# Patient Record
Sex: Male | Born: 1945 | Race: Black or African American | Hispanic: No | Marital: Married | Smoking: Never smoker
Health system: Southern US, Community
[De-identification: ages and names within clinical notes are randomized; demographics above are authoritative.]

## PROBLEM LIST (undated history)

## (undated) DIAGNOSIS — T7840XA Allergy, unspecified, initial encounter: Secondary | ICD-10-CM

## (undated) HISTORY — DX: Allergy, unspecified, initial encounter: T78.40XA

---

## 2002-02-20 ENCOUNTER — Emergency Department (HOSPITAL_COMMUNITY): Admission: EM | Admit: 2002-02-20 | Discharge: 2002-02-20 | Payer: Self-pay | Admitting: Emergency Medicine

## 2006-09-24 ENCOUNTER — Ambulatory Visit (HOSPITAL_COMMUNITY): Admission: RE | Admit: 2006-09-24 | Discharge: 2006-09-24 | Payer: Self-pay | Admitting: Cardiology

## 2008-11-30 ENCOUNTER — Encounter: Admission: RE | Admit: 2008-11-30 | Discharge: 2008-11-30 | Payer: Self-pay | Admitting: Specialist

## 2009-04-23 ENCOUNTER — Ambulatory Visit (HOSPITAL_COMMUNITY): Admission: RE | Admit: 2009-04-23 | Discharge: 2009-04-23 | Payer: Self-pay | Admitting: Urology

## 2011-08-29 ENCOUNTER — Ambulatory Visit (INDEPENDENT_AMBULATORY_CARE_PROVIDER_SITE_OTHER): Payer: 59 | Admitting: Family Medicine

## 2011-08-29 VITALS — BP 126/83 | HR 73 | Temp 98.0°F | Resp 16 | Ht 67.0 in | Wt 177.0 lb

## 2011-08-29 DIAGNOSIS — R5381 Other malaise: Secondary | ICD-10-CM

## 2011-08-29 DIAGNOSIS — R358 Other polyuria: Secondary | ICD-10-CM

## 2011-08-29 DIAGNOSIS — Z0000 Encounter for general adult medical examination without abnormal findings: Secondary | ICD-10-CM

## 2011-08-29 DIAGNOSIS — R3589 Other polyuria: Secondary | ICD-10-CM

## 2011-08-29 DIAGNOSIS — R5383 Other fatigue: Secondary | ICD-10-CM

## 2011-08-29 LAB — POCT CBC
Granulocyte percent: 61.9 %G (ref 37–80)
HCT, POC: 40.4 % — AB (ref 43.5–53.7)
Hemoglobin: 13.2 g/dL — AB (ref 14.1–18.1)
Lymph, poc: 1.8 (ref 0.6–3.4)
MCH, POC: 29.4 pg (ref 27–31.2)
MCHC: 32.7 g/dL (ref 31.8–35.4)
MCV: 89.9 fL (ref 80–97)
MID (cbc): 0.5 (ref 0–0.9)
MPV: 9 fL (ref 0–99.8)
POC Granulocyte: 3.8 (ref 2–6.9)
POC LYMPH PERCENT: 29.8 %L (ref 10–50)
POC MID %: 8.3 %M (ref 0–12)
Platelet Count, POC: 234 10*3/uL (ref 142–424)
RBC: 4.49 M/uL — AB (ref 4.69–6.13)
RDW, POC: 14.2 %
WBC: 6.2 10*3/uL (ref 4.6–10.2)

## 2011-08-29 LAB — POCT URINALYSIS DIPSTICK
Bilirubin, UA: NEGATIVE
Glucose, UA: NEGATIVE
Ketones, UA: NEGATIVE
Leukocytes, UA: NEGATIVE
Nitrite, UA: NEGATIVE
Protein, UA: 300
Spec Grav, UA: 1.025
Urobilinogen, UA: 0.2
pH, UA: 6

## 2011-08-29 LAB — POCT UA - MICROSCOPIC ONLY
Bacteria, U Microscopic: NEGATIVE
Casts, Ur, LPF, POC: NEGATIVE
Crystals, Ur, HPF, POC: NEGATIVE
Mucus, UA: NEGATIVE
Yeast, UA: NEGATIVE

## 2011-08-29 LAB — PSA: PSA: 2.25 ng/mL (ref <=4.00)

## 2011-08-29 LAB — GLUCOSE, POCT (MANUAL RESULT ENTRY): POC Glucose: 86

## 2011-08-29 MED ORDER — ONDANSETRON 4 MG PO TBDP: 4.0000 mg | ORAL_TABLET | Freq: Three times a day (TID) | ORAL | Status: AC | PRN

## 2011-08-29 MED ORDER — MELOXICAM 7.5 MG PO TABS: 7.5000 mg | ORAL_TABLET | Freq: Every day | ORAL | Status: DC

## 2011-08-29 NOTE — Progress Notes (Signed)
66 yo man with malaise and nausea x 24 hours, worsening.  Notes hyperactive BS.  Some increased urination lately.  Feels tired. No N,V,D, cough, abdominal pain, body aches, fever, sweats, rash, blood in stool  O:  NAD, appears worried HEENT: some swollen turinates Chest: clear Heart: reg, no murmur or gallop Abd: hyperactive BS, nontender without mass or HSM Skin:  Warm and dry Results for orders placed in visit on 08/29/11  POCT CBC      Component Value Range   WBC 6.2  4.6 - 10.2 (K/uL)   Lymph, poc 1.8  0.6 - 3.4    POC LYMPH PERCENT 29.8  10 - 50 (%L)   MID (cbc) 0.5  0 - 0.9    POC MID % 8.3  0 - 12 (%M)   POC Granulocyte 3.8  2 - 6.9    Granulocyte percent 61.9  37 - 80 (%G)   RBC 4.49 (*) 4.69 - 6.13 (M/uL)   Hemoglobin 13.2 (*) 14.1 - 18.1 (g/dL)   HCT, POC 16.1 (*) 09.6 - 53.7 (%)   MCV 89.9  80 - 97 (fL)   MCH, POC 29.4  27 - 31.2 (pg)   MCHC 32.7  31.8 - 35.4 (g/dL)   RDW, POC 04.5     Platelet Count, POC 234  142 - 424 (K/uL)   MPV 9.0  0 - 99.8 (fL)  GLUCOSE, POCT (MANUAL RESULT ENTRY)      Component Value Range   POC Glucose 86    POCT UA - MICROSCOPIC ONLY      Component Value Range   WBC, Ur, HPF, POC 0-1     RBC, urine, microscopic 0-2     Bacteria, U Microscopic neg     Mucus, UA neg     Epithelial cells, urine per micros 1-2     Crystals, Ur, HPF, POC neg     Casts, Ur, LPF, POC neg     Yeast, UA neg    POCT URINALYSIS DIPSTICK      Component Value Range   Color, UA yellow     Clarity, UA clear     Glucose, UA neg     Bilirubin, UA neg     Ketones, UA neg     Spec Grav, UA 1.025     Blood, UA tr     pH, UA 6.0     Protein, UA 300     Urobilinogen, UA 0.2     Nitrite, UA neg     Leukocytes, UA Negative

## 2012-08-20 ENCOUNTER — Ambulatory Visit (INDEPENDENT_AMBULATORY_CARE_PROVIDER_SITE_OTHER): Payer: 59 | Admitting: Physician Assistant

## 2012-08-20 VITALS — BP 110/80 | HR 80 | Temp 98.2°F | Resp 18 | Ht 67.0 in | Wt 174.0 lb

## 2012-08-20 DIAGNOSIS — Z0000 Encounter for general adult medical examination without abnormal findings: Secondary | ICD-10-CM

## 2012-08-20 DIAGNOSIS — Z1211 Encounter for screening for malignant neoplasm of colon: Secondary | ICD-10-CM

## 2012-08-20 DIAGNOSIS — Z23 Encounter for immunization: Secondary | ICD-10-CM

## 2012-08-20 DIAGNOSIS — Z1159 Encounter for screening for other viral diseases: Secondary | ICD-10-CM

## 2012-08-20 DIAGNOSIS — Z125 Encounter for screening for malignant neoplasm of prostate: Secondary | ICD-10-CM

## 2012-08-20 LAB — LIPID PANEL
HDL: 58 mg/dL (ref >39)
LDL Cholesterol: 168 mg/dL — ABNORMAL HIGH (ref 0–99)
Triglycerides: 118 mg/dL (ref <150)

## 2012-08-20 LAB — POCT UA - MICROSCOPIC ONLY
Casts, Ur, LPF, POC: NEGATIVE
Crystals, Ur, HPF, POC: NEGATIVE
Epithelial cells, urine per micros: NEGATIVE
Yeast, UA: NEGATIVE

## 2012-08-20 LAB — POCT CBC
HCT, POC: 50.6 % (ref 43.5–53.7)
Lymph, poc: 2.5 (ref 0.6–3.4)
MCHC: 31.6 g/dL — AB (ref 31.8–35.4)
MCV: 93.4 fL (ref 80–97)
MID (cbc): 0.6 (ref 0–0.9)
POC Granulocyte: 3.9 (ref 2–6.9)
POC LYMPH PERCENT: 35.7 %L (ref 10–50)
RDW, POC: 13.4 %

## 2012-08-20 LAB — COMPREHENSIVE METABOLIC PANEL
AST: 52 U/L — ABNORMAL HIGH (ref 0–37)
Alkaline Phosphatase: 53 U/L (ref 39–117)
BUN: 13 mg/dL (ref 6–23)
Calcium: 9.9 mg/dL (ref 8.4–10.5)
Chloride: 104 mEq/L (ref 96–112)
Creat: 1 mg/dL (ref 0.50–1.35)
Total Bilirubin: 0.9 mg/dL (ref 0.3–1.2)

## 2012-08-20 LAB — POCT URINALYSIS DIPSTICK
Nitrite, UA: NEGATIVE
Protein, UA: >=300
Urobilinogen, UA: 0.2
pH, UA: 5.5

## 2012-08-20 MED ORDER — ZOSTER VACCINE LIVE 19400 UNT/0.65ML ~~LOC~~ SOLR: 0.6500 mL | Freq: Once | SUBCUTANEOUS | Status: DC

## 2012-08-20 NOTE — Patient Instructions (Signed)

## 2012-08-20 NOTE — Progress Notes (Signed)
Subjective:    Patient ID: Justin Mclaughlin, male    DOB: [birth year], 67 y.o.   MRN: [id number]  HPI This 67 y.o. male presents for CPE.  In particular, he wants to make sure his blood sugar and cholesterol are normal.  Colonoscopy was 1-2 years ago and normal.  PSA 1-2 years ago.  Doesn't know when he's had a tetanus vaccine.  Desires pneumococcal and shingles vaccines.   Past Medical History  Diagnosis Date  . Allergy     History reviewed. No pertinent past surgical history.  Prior to Admission medications   Medication Sig Start Date End Date Taking? Authorizing Provider  aspirin 325 MG tablet Take 325 mg by mouth daily.   Yes Historical Provider, MD    No Known Allergies  History   Social History  . Marital Status: Married    Spouse Name: N/A    Number of Children: 5  . Years of Education: 12   Occupational History  . 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 Smoking status: Never Smoker   . Smokeless tobacco: Never Used  . Alcohol Use: 0 - 1 oz/week    0-2 drink(s) per week     Comment: once a month  . Drug Use: No  . Sexually Active: Yes -- Male partner(s)   Other Topics Concern  . Not on file   Social History Narrative   From [place], in the [place] since 2001.  Lives with his wife.  Their children are grown.    Family History  Problem Relation Age of Onset  . Cancer Sister     breast cancer     Review of Systems  Constitutional: Negative.   HENT: Negative.   Eyes: Positive for visual disturbance (evaluation ongoing at a vision center). Negative for photophobia, pain, discharge, redness and itching.  Respiratory: Negative.   Cardiovascular: Negative.   Gastrointestinal: Negative.   Endocrine: Negative.   Genitourinary: Negative.        Nocturia treated with an OTC product.  Musculoskeletal: Positive for arthralgias (with long work hours-ASA with relief). Negative for myalgias, back pain, joint swelling and gait  problem.  Skin: Negative.   Allergic/Immunologic: Negative.   Neurological: Negative.   Hematological: Negative.   Psychiatric/Behavioral: Negative.        Objective:   Physical Exam  Vitals reviewed. Constitutional: He is oriented to person, place, and time. Vital signs are normal. He appears well-developed and well-nourished. He is active and cooperative.  Non-toxic appearance. He does not have a sickly appearance. He does not appear ill. No distress.  HENT:  Head: Normocephalic and atraumatic. No trismus in the jaw.  Right Ear: Hearing, tympanic membrane, external ear and ear canal normal.  Left Ear: Hearing, tympanic membrane, external ear and ear canal normal.  Nose: Nose normal.  Mouth/Throat: Uvula is midline, oropharynx is clear and moist and mucous membranes are normal. He does not have dentures. No oral lesions. Normal dentition. No dental abscesses, edematous, lacerations or dental caries.  Eyes: Conjunctivae and EOM are normal. Pupils are equal, round, and reactive to light. Right eye exhibits no discharge. Left eye exhibits no discharge. No scleral icterus.  Fundoscopic exam:      The right eye shows no arteriolar narrowing, no AV nicking, no exudate, no hemorrhage and no papilledema.       The left eye shows no arteriolar narrowing, no AV nicking, no exudate, no hemorrhage and no papilledema.  Neck: Normal range  of motion, full passive range of motion without pain and phonation normal. Neck supple. No spinous process tenderness and no muscular tenderness present. No rigidity. No tracheal deviation, no edema, no erythema and normal range of motion present. No thyromegaly present.  Cardiovascular: Normal rate, regular rhythm, S1 normal, S2 normal, normal heart sounds, intact distal pulses and normal pulses.  Exam reveals no gallop and no friction rub.   No murmur heard. Pulmonary/Chest: Effort normal and breath sounds normal. No respiratory distress. He has no wheezes. He has no  rales.  Abdominal: Soft. Normal appearance and bowel sounds are normal. He exhibits no distension and no mass. There is no hepatosplenomegaly. There is no tenderness. There is no rebound and no guarding. No hernia. Hernia confirmed negative in the right inguinal area and confirmed negative in the left inguinal area.  Genitourinary: Rectum normal, prostate normal, testes normal and penis normal. Rectal exam shows no external hemorrhoid, no internal hemorrhoid, no fissure, no mass, no tenderness and anal tone normal. Guaiac negative stool. Uncircumcised. No phimosis, paraphimosis, hypospadias, penile erythema or penile tenderness. No discharge found.  Musculoskeletal: Normal range of motion. He exhibits no edema and no tenderness.       Right shoulder: Normal.       Left shoulder: Normal.       Right elbow: Normal.      Left elbow: Normal.       Right wrist: Normal.       Left wrist: Normal.       Right hip: Normal.       Left hip: Normal.       Right knee: Normal.       Left knee: Normal.       Right ankle: Normal. Achilles tendon normal.       Left ankle: Normal. Achilles tendon normal.       Cervical back: Normal. He exhibits normal range of motion, no tenderness, no bony tenderness, no swelling, no edema, no deformity, no laceration, no pain, no spasm and normal pulse.       Thoracic back: Normal.       Lumbar back: Normal.       Right upper arm: Normal.       Left upper arm: Normal.       Right forearm: Normal.       Left forearm: Normal.       Right hand: Normal.       Left hand: Normal.       Right upper leg: Normal.       Left upper leg: Normal.       Right lower leg: Normal.       Left lower leg: Normal.       Right foot: Normal.       Left foot: Normal.  Lymphadenopathy:       Head (right side): No submental, no submandibular, no tonsillar, no preauricular, no posterior auricular and no occipital adenopathy present.       Head (left side): No submental, no submandibular, no  tonsillar, no preauricular, no posterior auricular and no occipital adenopathy present.    He has no cervical adenopathy.       Right: No inguinal and no supraclavicular adenopathy present.       Left: No inguinal and no supraclavicular adenopathy present.  Neurological: He is alert and oriented to person, place, and time. He has normal strength and normal reflexes. He displays no tremor. No cranial nerve deficit. He exhibits  normal muscle tone. Coordination and gait normal.  Skin: Skin is warm, dry and intact. No abrasion, no ecchymosis, no laceration, no lesion and no rash noted. He is not diaphoretic. No cyanosis or erythema. No pallor. Nails show no clubbing.  Psychiatric: He has a normal mood and affect. His speech is normal and behavior is normal. Judgment and thought content normal. Cognition and memory are normal.      Results for orders placed in visit on 08/20/12  POCT CBC      Result Value Range   WBC 6.9  4.6 - 10.2 K/uL   Lymph, poc 2.5  0.6 - 3.4   POC LYMPH PERCENT 35.7  10 - 50 %L   MID (cbc) 0.6  0 - 0.9   POC MID % 8.4  0 - 12 %M   POC Granulocyte 3.9  2 - 6.9   Granulocyte percent 55.9  37 - 80 %G   RBC 5.42  4.69 - 6.13 M/uL   Hemoglobin 16.0  14.1 - 18.1 g/dL   HCT, POC 13.0  86.5 - 53.7 %   MCV 93.4  80 - 97 fL   MCH, POC 29.5  27 - 31.2 pg   MCHC 31.6 (*) 31.8 - 35.4 g/dL   RDW, POC 78.4     Platelet Count, POC 309  142 - 424 K/uL   MPV 9.1  0 - 99.8 fL  IFOBT (OCCULT BLOOD)      Result Value Range   IFOBT Negative    POCT UA - MICROSCOPIC ONLY      Result Value Range   WBC, Ur, HPF, POC 0-1     RBC, urine, microscopic 0-1     Bacteria, U Microscopic trace     Mucus, UA small     Epithelial cells, urine per micros neg     Crystals, Ur, HPF, POC neg     Casts, Ur, LPF, POC neg     Yeast, UA neg    POCT URINALYSIS DIPSTICK      Result Value Range   Color, UA yellow     Clarity, UA clear     Glucose, UA neg     Bilirubin, UA neg     Ketones, UA neg      Spec Grav, UA >=1.030     Blood, UA small     pH, UA 5.5     Protein, UA >=300     Urobilinogen, UA 0.2     Nitrite, UA neg     Leukocytes, UA Negative         Assessment & Plan:  Routine general medical examination at a health care facility - Plan: POCT CBC, POCT UA - Microscopic Only, POCT urinalysis dipstick, Comprehensive metabolic panel, Lipid panel, TSH; age appropriate anticipatory guidance provided.  Screening for colon cancer - Plan: IFOBT POC (occult bld, rslt in office)  Screening for prostate cancer - Plan: PSA  Need for Tdap vaccination - Plan: Tdap vaccine greater than or equal to 7yo IM  Need for pneumococcal vaccination - Plan: Pneumococcal polysaccharide vaccine 23-valent greater than or equal to 2yo subcutaneous/IM  Need for shingles vaccine - Plan: zoster vaccine live, PF, (ZOSTAVAX) 69629 UNT/0.65ML injection  Need for hepatitis C screening test - Plan: Hepatitis C antibody

## 2012-08-21 ENCOUNTER — Encounter: Payer: Self-pay | Admitting: Physician Assistant

## 2012-08-23 ENCOUNTER — Telehealth: Payer: Self-pay

## 2012-08-23 DIAGNOSIS — Z23 Encounter for immunization: Secondary | ICD-10-CM

## 2012-08-23 MED ORDER — ZOSTER VACCINE LIVE 19400 UNT/0.65ML ~~LOC~~ SOLR: 0.6500 mL | Freq: Once | SUBCUTANEOUS | Status: DC

## 2012-08-23 NOTE — Telephone Encounter (Signed)
Spoke w/pt and explained that [facility] does not do the inj for Shingles virus. Pt requested Rx be resent to [facility] who does administer the injections.

## 2012-08-28 ENCOUNTER — Telehealth: Payer: Self-pay

## 2012-08-28 ENCOUNTER — Telehealth: Payer: Self-pay | Admitting: Radiology

## 2012-08-28 DIAGNOSIS — E785 Hyperlipidemia, unspecified: Secondary | ICD-10-CM

## 2012-08-28 MED ORDER — SIMVASTATIN 20 MG PO TABS: 20.0000 mg | ORAL_TABLET | Freq: Every day | ORAL | Status: DC

## 2012-08-28 NOTE — Telephone Encounter (Signed)
Rx sent.  Please ask him to take it in the evening with his meal.  Recheck in 3 months.  Meds ordered this encounter  Medications  . simvastatin (ZOCOR) 20 MG tablet    Sig: Take 1 tablet (20 mg total) by mouth at bedtime.    Dispense:  90 tablet    Refill:  3    Order Specific Question:  Supervising Provider    Answer:  DOOLITTLE, ROBERT P [[id number]]

## 2012-08-28 NOTE — Telephone Encounter (Signed)
Left message for him to advise  

## 2012-08-28 NOTE — Telephone Encounter (Signed)
PATIENT STATES THAT HE RECEIVED A LETTER STATING THAT HIS CHOLESTEROL IS HIGH. WANTS TO KNOW IF HE NEEDS AN RX FOR THIS. ALSO WANTS TO GET ANY OTHER RESULTS FROM HIS LAST VISIT. PLEASE CALL AT [phone].

## 2012-08-28 NOTE — Telephone Encounter (Signed)
Patient called back, and Luster Landsberg' has given him the information.

## 2012-08-28 NOTE — Telephone Encounter (Signed)
The test results show that your kidney and liver function, thyroid function, blood sugar and prostate are all normal. However, your cholesterol is too high.  I recommend that you start a prescription medication to lower it. Please contact my office to let me know whether or not you are agreeable with that. We recommend that you repeat the above test(s) in 3 months. This is from letter, he does want medication for this, please advise.

## 2013-01-27 ENCOUNTER — Ambulatory Visit (INDEPENDENT_AMBULATORY_CARE_PROVIDER_SITE_OTHER): Payer: 59 | Admitting: Family Medicine

## 2013-01-27 VITALS — BP 122/78 | HR 62 | Temp 98.1°F | Resp 18 | Ht 61.0 in | Wt 161.7 lb

## 2013-01-27 DIAGNOSIS — E7800 Pure hypercholesterolemia, unspecified: Secondary | ICD-10-CM

## 2013-01-27 DIAGNOSIS — R809 Proteinuria, unspecified: Secondary | ICD-10-CM

## 2013-01-27 DIAGNOSIS — Z23 Encounter for immunization: Secondary | ICD-10-CM

## 2013-01-27 LAB — LIPID PANEL
HDL: 64 mg/dL (ref >39)
LDL Cholesterol: 150 mg/dL — ABNORMAL HIGH (ref 0–99)
Triglycerides: 75 mg/dL (ref <150)

## 2013-01-27 LAB — POCT URINALYSIS DIPSTICK
Bilirubin, UA: NEGATIVE
Ketones, UA: NEGATIVE
Spec Grav, UA: >=1.03
pH, UA: 5.5

## 2013-01-27 LAB — POCT UA - MICROSCOPIC ONLY
Bacteria, U Microscopic: NEGATIVE
Casts, Ur, LPF, POC: NEGATIVE
Crystals, Ur, HPF, POC: NEGATIVE
Mucus, UA: POSITIVE

## 2013-01-27 LAB — COMPREHENSIVE METABOLIC PANEL
Albumin: 4.4 g/dL (ref 3.5–5.2)
Alkaline Phosphatase: 54 U/L (ref 39–117)
BUN: 15 mg/dL (ref 6–23)
Calcium: 9.8 mg/dL (ref 8.4–10.5)
Glucose, Bld: 81 mg/dL (ref 70–99)
Potassium: 4.7 mEq/L (ref 3.5–5.3)

## 2013-01-27 NOTE — Patient Instructions (Addendum)
I will be in touch with your labs when they come in.       Influenza Vaccine (Flu Vaccine, Inactivated) 2013 2014 What You Need to Know WHY GET VACCINATED?  Influenza ("flu") is a contagious disease that spreads around the [place] every winter, usually between October and May.  Flu is caused by the influenza virus, and can be spread by coughing, sneezing, and close contact.  Anyone can get flu, but the risk of getting flu is highest among children. Symptoms come on suddenly and may last several days. They can include:  Fever or chills.  Sore throat.  Muscle aches.  Fatigue.  Cough.  Headache.  Runny or stuffy nose. Flu can make some people much sicker than others. These people include young children, people 89 and older, pregnant women, and people with certain health conditions such as heart, lung or kidney disease, or a weakened immune system. Flu vaccine is especially important for these people, and anyone in close contact with them. Flu can also lead to pneumonia, and make existing medical conditions worse. It can cause diarrhea and seizures in children. Each year thousands of people in the [place] die from flu, and many more are hospitalized. Flu vaccine is the best protection we have from flu and its complications. Flu vaccine also helps prevent spreading flu from person to person. INACTIVATED FLU VACCINE There are 2 types of influenza vaccine:  You are getting an inactivated flu vaccine, which does not contain any live influenza virus. It is given by injection with a needle, and often called the "flu shot."  A different live, attenuated (weakened) influenza vaccine is sprayed into the nostrils. This vaccine is described in a separate Vaccine Information Statement. Flu vaccine is recommended every year. Children 6 months through 37 years of age should get 2 doses the first year they get vaccinated. Flu viruses are always changing. Each year's flu vaccine is made  to protect from viruses that are most likely to cause disease that year. While flu vaccine cannot prevent all cases of flu, it is our best defense against the disease. Inactivated flu vaccine protects against 3 or 4 different influenza viruses. It takes about 2 weeks for protection to develop after the vaccination, and protection lasts several months to a year. Some illnesses that are not caused by influenza virus are often mistaken for flu. Flu vaccine will not prevent these illnesses. It can only prevent influenza. A "high-dose" flu vaccine is available for people 52 years of age and older. The person giving you the vaccine can tell you more about it. Some inactivated flu vaccine contains a very small amount of a mercury-based preservative called thimerosal. Studies have shown that thimerosal in vaccines is not harmful, but flu vaccines that do not contain a preservative are available. SOME PEOPLE SHOULD NOT GET THIS VACCINE Tell the person who gives you the vaccine:  If you have any severe (life-threatening) allergies. If you ever had a life-threatening allergic reaction after a dose of flu vaccine, or have a severe allergy to any part of this vaccine, you may be advised not to get a dose. Most, but not all, types of flu vaccine contain a small amount of egg.  If you ever had Guillain Barr Syndrome (a severe paralyzing illness, also called GBS). Some people with a history of GBS should not get this vaccine. This should be discussed with your doctor.  If you are not feeling well. They might suggest waiting until you feel  better. But you should come back. RISKS OF A VACCINE REACTION With a vaccine, like any medicine, there is a chance of side effects. These are usually mild and go away on their own. Serious side effects are also possible, but are very rare. Inactivated flu vaccine does not contain live flu virus, sogetting flu from this vaccine is not possible. Brief fainting spells and related  symptoms (such as jerking movements) can happen after any medical procedure, including vaccination. Sitting or lying down for about 15 minutes after a vaccination can help prevent fainting and injuries caused by falls. Tell your doctor if you feel dizzy or lightheaded, or have vision changes or ringing in the ears. Mild problems following inactivated flu vaccine:  Soreness, redness, or swelling where the shot was given.  Hoarseness; sore, red or itchy eyes; or cough.  Fever.  Aches.  Headache.  Itching.  Fatigue. If these problems occur, they usually begin soon after the shot and last 1 or 2 days. Moderate problems following inactivated flu vaccine:  Young children who get inactivated flu vaccine and pneumococcal vaccine (PCV13) at the same time may be at increased risk for seizures caused by fever. Ask your doctor for more information. Tell your doctor if a child who is getting flu vaccine has ever had a seizure. Severe problems following inactivated flu vaccine:  A severe allergic reaction could occur after any vaccine (estimated less than 1 in a million doses).  There is a small possibility that inactivated flu vaccine could be associated with Guillan Barr Syndrome (GBS), no more than 1 or 2 cases per million people vaccinated. This is much lower than the risk of severe complications from flu, which can be prevented by flu vaccine. The safety of vaccines is always being monitored. For more information, visit: http://floyd.org/ WHAT IF THERE IS A SERIOUS REACTION? What should I look for?  Look for anything that concerns you, such as signs of a severe allergic reaction, very high fever, or behavior changes. Signs of a severe allergic reaction can include hives, swelling of the face and throat, difficulty breathing, a fast heartbeat, dizziness, and weakness. These would start a few minutes to a few hours after the vaccination. What should I do?  If you think it is a severe  allergic reaction or other emergency that cannot wait, call 9 1 1  or get the person to the nearest hospital. Otherwise, call your doctor.  Afterward, the reaction should be reported to the Vaccine Adverse Event Reporting System (VAERS). Your doctor might file this report, or you can do it yourself through the VAERS website at www.vaers.LAgents.no, or by calling 1-(939) 577-8213. VAERS is only for reporting reactions. They do not give medical advice. THE NATIONAL VACCINE INJURY COMPENSATION PROGRAM The National Vaccine Injury Compensation Program (VICP) is a federal program that was created to compensate people who may have been injured by certain vaccines. Persons who believe they may have been injured by a vaccine can learn about the program and about filing a claim by calling 1-908-213-6441 or visiting the VICP website at SpiritualWord.at HOW CAN I LEARN MORE?  Ask your doctor.  Call your local or state health department.  Contact the Centers for Disease Control and Prevention (CDC):  Call (314)477-2392 (1-800-CDC-INFO) or  Visit CDC's website at BiotechRoom.com.cy CDC Inactivated Influenza Vaccine Interim VIS (12/15/11) Document Released: 03/02/2006 Document Revised: 01/31/2012 Document Reviewed: 12/15/2011 [facility] Patient Information 2014 [place], [place].

## 2013-01-27 NOTE — Progress Notes (Signed)
[facility] [address] [place] [address] [phone]  Date:  01/27/2013   Name:  Justin Mclaughlin   DOB:  06/04/1945   MRN:  [id number]  PCP:  No primary provider on file.    Chief Complaint: Follow-up   History of Present Illness:  Justin Mclaughlin is a 67 y.o. very pleasant male patient who presents with the following:  He is here today for a lab recheck.  He strated simvastatin in the spring time due to high cholesterol  He is fasting today for labs.  He was also told that he had proteinuria, and would like to reheck this today as well Overall he is doing well, no problems with his new medication Would like a flu shot today  There are no active problems to display for this patient.   Past Medical History  Diagnosis Date  . Allergy     History reviewed. No pertinent past surgical history.  History  Substance Use Topics  . Smoking status: Never Smoker   . Smokeless tobacco: Never Used  . Alcohol Use: 0 - 1 oz/week    0-2 drink(s) per week     Comment: once a month    Family History  Problem Relation Age of Onset  . Cancer Sister     breast cancer    No Known Allergies  Medication list has been reviewed and updated.  Current Outpatient Prescriptions on File Prior to Visit  Medication Sig Dispense Refill  . aspirin 325 MG tablet Take 325 mg by mouth daily.      . simvastatin (ZOCOR) 20 MG tablet Take 1 tablet (20 mg total) by mouth at bedtime.  90 tablet  3  . zoster vaccine live, PF, (ZOSTAVAX) 08657 UNT/0.65ML injection Inject 19,400 Units into the skin once.  1 each  0   No current facility-administered medications on file prior to visit.    Review of Systems:  As per HPI- otherwise negative.   Physical Examination: Filed Vitals:   01/27/13 1059  BP: 122/78  Pulse: 62  Temp: 98.1 F (36.7 C)  Resp: 18   Filed Vitals:   01/27/13 1059  Height: 5\' 1"  (1.549 m)  Weight: 161 lb 11.2 oz (73.347 kg)   Body mass  index is 30.57 kg/(m^2). Ideal Body Weight: Weight in (lb) to have BMI = 25: 132  GEN: WDWN, NAD, Non-toxic, A & O x 3, looks well HEENT: Atraumatic, Normocephalic. Neck supple. No masses, No LAD. Ears and Nose: No external deformity. CV: RRR, No M/G/R. No JVD. No thrill. No extra heart sounds. PULM: CTA B, no wheezes, crackles, rhonchi. No retractions. No resp. distress. No accessory muscle use. ABD: S, NT, ND. No rebound. No HSM. EXTR: No c/c/e NEURO Normal gait.  PSYCH: Normally interactive. Conversant. Not depressed or anxious appearing.  Calm demeanor.    Assessment and Plan: High cholesterol - Plan: Comprehensive metabolic panel, Lipid panel, Flu Vaccine QUAD 36+ mos IM  Proteinuria - Plan: POCT urinalysis dipstick, POCT UA - Microscopic Only, Microalbumin, urine  Will check on LFTs and cholesterol level today.   History of proteinuria; noted over the last year.  Will check renal function and microalbmin.  May need a 24 hour protein check.   Will plan further follow- up pending labs.   Signed Abbe Amsterdam, MD

## 2013-02-02 ENCOUNTER — Encounter: Payer: Self-pay | Admitting: Family Medicine

## 2013-02-02 ENCOUNTER — Telehealth: Payer: Self-pay | Admitting: Family Medicine

## 2013-02-02 NOTE — Addendum Note (Signed)
Addended by: Abbe Amsterdam C on: 02/02/2013 07:23 AM   Modules accepted: Orders

## 2013-02-02 NOTE — Telephone Encounter (Signed)
Called and went over his labs.  We will refer him to nephrology, please follow- up with [place] in 6 months.

## 2013-02-10 ENCOUNTER — Telehealth: Payer: Self-pay

## 2013-02-10 NOTE — Telephone Encounter (Signed)
Patient would like to speak to Dr. Patsy Lager. He just received lab results and has a few elevated results and wanted to know what he can do to bring them down.  Best [phone]

## 2013-02-10 NOTE — Telephone Encounter (Signed)
Patient advised he is to see a nephrologist for his proteinuria, he has not heard back yet. Will you check on this for him?

## 2013-03-10 ENCOUNTER — Telehealth: Payer: Self-pay

## 2013-03-10 ENCOUNTER — Encounter: Payer: Self-pay | Admitting: *Deleted

## 2013-03-10 ENCOUNTER — Ambulatory Visit (INDEPENDENT_AMBULATORY_CARE_PROVIDER_SITE_OTHER): Payer: Managed Care, Other (non HMO) | Admitting: Internal Medicine

## 2013-03-10 VITALS — BP 108/62 | HR 65 | Temp 97.8°F | Resp 16 | Ht 67.0 in | Wt 171.2 lb

## 2013-03-10 DIAGNOSIS — R809 Proteinuria, unspecified: Secondary | ICD-10-CM

## 2013-03-10 DIAGNOSIS — R350 Frequency of micturition: Secondary | ICD-10-CM

## 2013-03-10 DIAGNOSIS — E785 Hyperlipidemia, unspecified: Secondary | ICD-10-CM

## 2013-03-10 LAB — POCT UA - MICROSCOPIC ONLY
Bacteria, U Microscopic: NEGATIVE
Casts, Ur, LPF, POC: NEGATIVE
Epithelial cells, urine per micros: NEGATIVE
Mucus, UA: NEGATIVE
WBC, Ur, HPF, POC: NEGATIVE
Yeast, UA: NEGATIVE

## 2013-03-10 LAB — POCT URINALYSIS DIPSTICK
Protein, UA: 300
Spec Grav, UA: 1.025
pH, UA: 5.5

## 2013-03-10 MED ORDER — CIPROFLOXACIN HCL 500 MG PO TABS: 500.0000 mg | ORAL_TABLET | Freq: Two times a day (BID) | ORAL | Status: DC

## 2013-03-10 NOTE — Telephone Encounter (Signed)
Spoke to him, he wants medication for urinary frequency. Advised him to return to clinic.

## 2013-03-10 NOTE — Progress Notes (Signed)
Subjective:    Patient ID: Justin Mclaughlin, male    DOB: 02-Sep-1945, 67 y.o.   MRN: [id number]  HPI Having urinary frequency for several days. No hematuria, color darker than usual. Pain with urination intermittently, releived with tylenol. No back pain. No fever or chills. Nocturia x 3 last night- good night. Having to urinate every hour or more. Weak urine pressure. No discharge from penis.  Has had difficulty getting all of urine out for a long time. Had a biopsy several years ago. Saw urologist last year and was told his prostate was normal. Goes annually for exam and PSA.  Was advised by Dr. Dallas Schimke that he had proteinuria and was referred to nephrology. Patient did not understand this and reports that he did not receive an appointment.  Appetite normal, no diarrhea, no constipation.  Review of Systems  Constitutional: Negative for fever, chills, fatigue and unexpected weight change.  Respiratory: Negative for cough and shortness of breath.   Cardiovascular: Negative for chest pain.  Gastrointestinal: Negative for nausea, vomiting, abdominal pain, diarrhea, constipation, blood in stool, abdominal distention and rectal pain.  Genitourinary: Positive for dysuria, urgency and frequency. Negative for hematuria, flank pain, discharge and penile pain.  Musculoskeletal: Negative for back pain.       Objective:   Physical Exam  Nursing note and vitals reviewed. Constitutional: He is oriented to person, place, and time. He appears well-developed and well-nourished.  Cardiovascular: Normal rate, regular rhythm and normal heart sounds.   Pulmonary/Chest: Effort normal and breath sounds normal.  Abdominal: Soft. Bowel sounds are normal. He exhibits no distension and no mass. There is no tenderness. There is no rebound.  Genitourinary: Prostate normal.  Prostate smooth, non tender, non boggy.  Musculoskeletal: Normal range of motion. He exhibits no edema.  No flank pain.  Neurological:  He is alert and oriented to person, place, and time.  Skin: Skin is warm and dry.   Very pleasant man in NAD.  Results for orders placed in visit on 03/10/13  POCT URINALYSIS DIPSTICK      Result Value Range   Color, UA yellow     Clarity, UA clear     Glucose, UA neg     Bilirubin, UA nge     Ketones, UA trace     Spec Grav, UA 1.025     Blood, UA trace     pH, UA 5.5     Protein, UA 300     Urobilinogen, UA 1.0     Nitrite, UA neg     Leukocytes, UA Negative    POCT UA - MICROSCOPIC ONLY      Result Value Range   WBC, Ur, HPF, POC neg     RBC, urine, microscopic 0-2     Bacteria, U Microscopic neg     Mucus, UA neg     Epithelial cells, urine per micros neg     Crystals, Ur, HPF, POC neg     Casts, Ur, LPF, POC neg     Yeast, UA neg         Assessment & Plan:  Urinary frequency - Plan: POCT urinalysis dipstick, POCT UA - Microscopic Only, Urine culture, Ambulatory referral to Urology  1- discussed UA and physical findings with patient. His symptoms are certainly consistent with prostatitis or with a urinary outflow obstruction.   Will culture urine and start Cipro 500 mg po BID with referral to Urology for urinary frequency and decreased flow. 2- Continued proteinuria.  Discussed importance of Nephrology follow up. Will refer.  DG/RPD

## 2013-03-10 NOTE — Telephone Encounter (Signed)
PT WOULD LIKE TO HAVE HIS MEDICATION CHANGED. PLEASE CALL [phone]   [facility] [address]

## 2013-09-26 ENCOUNTER — Ambulatory Visit (INDEPENDENT_AMBULATORY_CARE_PROVIDER_SITE_OTHER): Payer: Managed Care, Other (non HMO) | Admitting: Emergency Medicine

## 2013-09-26 VITALS — BP 122/78 | HR 61 | Temp 97.5°F | Resp 18 | Ht 67.0 in | Wt 167.2 lb

## 2013-09-26 DIAGNOSIS — J018 Other acute sinusitis: Secondary | ICD-10-CM

## 2013-09-26 MED ORDER — AMOXICILLIN-POT CLAVULANATE 875-125 MG PO TABS: 1.0000 | ORAL_TABLET | Freq: Two times a day (BID) | ORAL | Status: DC

## 2013-09-26 MED ORDER — PSEUDOEPHEDRINE-GUAIFENESIN ER 60-600 MG PO TB12: 1.0000 | ORAL_TABLET | Freq: Two times a day (BID) | ORAL | Status: DC

## 2013-09-26 NOTE — Patient Instructions (Signed)

## 2013-09-26 NOTE — Progress Notes (Signed)
[facility] [address] [place] [address] [phone]  Date:  09/26/2013   Name:  Justin Mclaughlin   DOB:  21-Aug-1945   MRN:  [id number]  PCP:  No primary provider on file.    Chief Complaint: Headache, Cough and sneezing   History of Present Illness:  Justin Mclaughlin is a 68 y.o. very pleasant male patient who presents with the following:  Ill with nasal congestion, drainage and post nasal drip.  Fatigued and malaise Non productive cough.  No fever or chills.  No nausea or vomiting.  No stool change.  No rash.  No improvement with OTC medications.  History of seasonal allergic rhinitis.  No wheezing or shortness of breath.  Denies other complaint or health concern today.   Patient Active Problem List   Diagnosis Date Noted  . High cholesterol 01/27/2013  . Proteinuria 01/27/2013    Past Medical History  Diagnosis Date  . Allergy     History reviewed. No pertinent past surgical history.  History  Substance Use Topics  . Smoking status: Never Smoker   . Smokeless tobacco: Never Used  . Alcohol Use: 0.0 - 1.0 oz/week    0-2 drink(s) per week     Comment: once a month    Family History  Problem Relation Age of Onset  . Cancer Sister     breast cancer    No Known Allergies  Medication list has been reviewed and updated.  Current Outpatient Prescriptions on File Prior to Visit  Medication Sig Dispense Refill  . Acetaminophen (TYLENOL 8 HOUR PO) Take by mouth.      Marland Kitchen aspirin 325 MG tablet Take 325 mg by mouth daily.      . ciprofloxacin (CIPRO) 500 MG tablet Take 1 tablet (500 mg total) by mouth 2 (two) times daily.  20 tablet  0  . simvastatin (ZOCOR) 20 MG tablet Take 1 tablet (20 mg total) by mouth at bedtime.  90 tablet  3  . zoster vaccine live, PF, (ZOSTAVAX) 13244 UNT/0.65ML injection Inject 19,400 Units into the skin once.  1 each  0   No current facility-administered medications on file prior to visit.    Review of  Systems:  As per HPI, otherwise negative.    Physical Examination: Filed Vitals:   09/26/13 1031  BP: 122/78  Pulse: 61  Temp: 97.5 F (36.4 C)  Resp: 18   Filed Vitals:   09/26/13 1031  Height: 5\' 7"  (1.702 m)  Weight: 167 lb 3.2 oz (75.841 kg)   Body mass index is 26.18 kg/(m^2). Ideal Body Weight: Weight in (lb) to have BMI = 25: 159.3  GEN: WDWN, NAD, Non-toxic, A & O x 3 HEENT: Atraumatic, Normocephalic. Neck supple. No masses, No LAD. Ears and Nose: No external deformity. CV: RRR, No M/G/R. No JVD. No thrill. No extra heart sounds. PULM: CTA B, no wheezes, crackles, rhonchi. No retractions. No resp. distress. No accessory muscle use. ABD: S, NT, ND, +BS. No rebound. No HSM. EXTR: No c/c/e NEURO Normal gait.  PSYCH: Normally interactive. Conversant. Not depressed or anxious appearing.  Calm demeanor.    Assessment and Plan: Sinusitis augmentin mucinex d  Signed,  Ellison Carwin, MD

## 2013-11-20 ENCOUNTER — Encounter: Payer: Self-pay | Admitting: Family Medicine

## 2013-11-20 DIAGNOSIS — D472 Monoclonal gammopathy: Secondary | ICD-10-CM | POA: Insufficient documentation

## 2014-02-24 ENCOUNTER — Ambulatory Visit (INDEPENDENT_AMBULATORY_CARE_PROVIDER_SITE_OTHER): Payer: BC Managed Care – PPO | Admitting: Family Medicine

## 2014-02-24 VITALS — BP 132/78 | HR 66 | Temp 98.2°F | Resp 16 | Ht 67.0 in | Wt 169.0 lb

## 2014-02-24 DIAGNOSIS — M62830 Muscle spasm of back: Secondary | ICD-10-CM

## 2014-02-24 MED ORDER — NAPROXEN 500 MG PO TABS: 500.0000 mg | ORAL_TABLET | Freq: Two times a day (BID) | ORAL | Status: DC | PRN

## 2014-02-24 MED ORDER — CYCLOBENZAPRINE HCL 5 MG PO TABS: 5.0000 mg | ORAL_TABLET | Freq: Three times a day (TID) | ORAL | Status: DC | PRN
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
## 2014-03-03 ENCOUNTER — Telehealth: Payer: Self-pay

## 2014-03-03 ENCOUNTER — Encounter: Payer: Self-pay | Admitting: Family Medicine

## 2014-03-03 NOTE — Telephone Encounter (Signed)
Patients job called and said they need to know what type of light duty the patient is restricted to. She says they need a detailed note. Please contact patient to advise

## 2014-03-04 NOTE — Telephone Encounter (Signed)
This is a W/C pt. Letter printed and copy put in pt paper chart and up front for pt to p/u. Pt notified.

## 2014-03-04 NOTE — Telephone Encounter (Signed)
Disregard last msg. This was not a w/c pt. Letter up front for p/u

## 2014-03-05 ENCOUNTER — Ambulatory Visit (INDEPENDENT_AMBULATORY_CARE_PROVIDER_SITE_OTHER): Payer: BC Managed Care – PPO

## 2014-03-05 ENCOUNTER — Ambulatory Visit (INDEPENDENT_AMBULATORY_CARE_PROVIDER_SITE_OTHER): Payer: BC Managed Care – PPO | Admitting: Family Medicine

## 2014-03-05 VITALS — BP 112/62 | HR 75 | Temp 97.6°F | Resp 16 | Ht 67.0 in | Wt 177.0 lb

## 2014-03-05 DIAGNOSIS — M5382 Other specified dorsopathies, cervical region: Secondary | ICD-10-CM

## 2014-03-05 DIAGNOSIS — M436 Torticollis: Secondary | ICD-10-CM

## 2014-03-05 MED ORDER — PREDNISONE 20 MG PO TABS: 40.0000 mg | ORAL_TABLET | Freq: Every day | ORAL | Status: DC
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
## 2014-03-05 NOTE — Patient Instructions (Signed)

## 2014-03-11 ENCOUNTER — Telehealth: Payer: Self-pay | Admitting: Family Medicine

## 2014-03-11 ENCOUNTER — Encounter: Payer: Self-pay | Admitting: Family Medicine
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
## 2014-03-13 NOTE — Telephone Encounter (Signed)
Pt FMLA ppw faxed

## 2014-03-17 DIAGNOSIS — Z0271 Encounter for disability determination: Secondary | ICD-10-CM

## 2014-05-11 ENCOUNTER — Ambulatory Visit (INDEPENDENT_AMBULATORY_CARE_PROVIDER_SITE_OTHER): Payer: BC Managed Care – PPO | Admitting: Family Medicine

## 2014-05-11 VITALS — BP 114/78 | HR 71 | Resp 18 | Ht 68.0 in | Wt 177.4 lb

## 2014-05-11 DIAGNOSIS — K645 Perianal venous thrombosis: Secondary | ICD-10-CM

## 2014-05-11 NOTE — Progress Notes (Signed)
[facility] [address], [place] [address] [phone]  Date:  05/11/2014   Name:  Justin Mclaughlin   DOB:  [birth year]   MRN:  [id number]  PCP:  No primary care provider on file.    Chief Complaint: Abscess   History of Present Illness:  Justin Mclaughlin is a 68 y.o. very pleasant male patient who presents with the following:  He has noted a lump near his behind- he thinks it may be an abscess. It is painful. It has been present for about 7 days. He tried something OTC for hemorrhoids but it did not work.  No drainage.  It is not getting worse. He has not noted any fever or chills.  He is able to have a BM but it hurts to wipe.  Besides this he feels ok today Never had this in the past  Patient Active Problem List   Diagnosis Date Noted  . 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 High cholesterol 01/27/2013  . Proteinuria 01/27/2013    Past Medical History  Diagnosis Date  . Allergy     History reviewed. No pertinent past surgical history.  History  Substance Use Topics  . Smoking status: Never Smoker   . Smokeless tobacco: Never Used  . Alcohol Use: 0.0 - 1.0 oz/week    0-2 drink(s) per week     Comment: once a month    Family History  Problem Relation Age of Onset  . Cancer Sister     breast cancer    No Known Allergies  Medication list has been reviewed and updated.  Current Outpatient Prescriptions on File Prior to Visit  Medication Sig Dispense Refill  . Acetaminophen (TYLENOL 8 HOUR PO) Take by mouth.    . cyclobenzaprine (FLEXERIL) 5 MG tablet Take 1 tablet (5 mg total) by mouth 3 (three) times daily as needed for muscle spasms. (Patient not taking: Reported on 05/11/2014) 30 tablet 1  . naproxen (NAPROSYN) 500 MG tablet Take 1 tablet (500 mg total) by mouth 2 (two) times daily as needed. (Patient not taking: Reported on 05/11/2014) 60 tablet 0  . predniSONE (DELTASONE) 20 MG tablet Take 2 tablets (40 mg total) by mouth  daily. (Patient not taking: Reported on 05/11/2014) 10 tablet 0   No current facility-administered medications on file prior to visit.    Review of Systems:  As per HPI- otherwise negative.   Physical Examination: Filed Vitals:   05/11/14 1124  BP: 114/78  Pulse: 71  Resp: 18   Filed Vitals:   05/11/14 1124  Height: 5\' 8"  (1.727 m)  Weight: 177 lb 6.4 oz (80.468 kg)   Body mass index is 26.98 kg/(m^2). Ideal Body Weight: Weight in (lb) to have BMI = 25: 164.1  GEN: WDWN, NAD, Non-toxic, A & O x 3, looks well HEENT: Atraumatic, Normocephalic. Neck supple. No masses, No LAD. Ears and Nose: No external deformity. CV: RRR, No M/G/R. No JVD. No thrill. No extra heart sounds. PULM: CTA B, no wheezes, crackles, rhonchi. No retractions. No resp. distress. No accessory muscle use. ABD: S, NT, ND. No rebound. No HSM. EXTR: No c/c/e NEURO Normal gait.  PSYCH: Normally interactive. Conversant. Not depressed or anxious appearing.  Calm demeanor.  Thrombosed hemorrhoid at 3:00 on rectal exam.  It is quite tender  VC obtained.  Prepped with betadine.  Placed approx 1.5 cc of 2% lidocaine.  Used 11 blade to incise hemorrhoid and evacuated clot.  Assessment and Plan: Thrombosed external hemorrhoid   Treated as above.  Counseled re after- care.  He will follow-up as needed.   See patient instructions for more details.    Signed Lamar Blinks, MD

## 2014-05-11 NOTE — Patient Instructions (Signed)
We treated you for a thrombosed (clotted) hemorrhoid today.   Sit in some warm water 2-3 times a day for the next several days and keep the area clean.  You may need some gauze or a pad for bleeding for a couple of days However if you have any significant bleeding please come back.   Take care!

## 2015-06-04 ENCOUNTER — Ambulatory Visit (INDEPENDENT_AMBULATORY_CARE_PROVIDER_SITE_OTHER): Payer: BLUE CROSS/BLUE SHIELD | Admitting: Family Medicine

## 2015-06-04 VITALS — BP 118/72 | HR 62 | Temp 97.8°F | Resp 18 | Ht 67.0 in | Wt 176.2 lb

## 2015-06-04 DIAGNOSIS — L750 Bromhidrosis: Secondary | ICD-10-CM

## 2015-06-04 DIAGNOSIS — L748 Other eccrine sweat disorders: Secondary | ICD-10-CM | POA: Diagnosis not present

## 2015-06-04 LAB — CBC
HCT: 43.6 % (ref 39.0–52.0)
HEMOGLOBIN: 14.2 g/dL (ref 13.0–17.0)
MCH: 29.6 pg (ref 26.0–34.0)
MCHC: 32.6 g/dL (ref 30.0–36.0)
MCV: 91 fL (ref 78.0–100.0)
MPV: 10 fL (ref 8.6–12.4)
Platelets: 237 10*3/uL (ref 150–400)
RBC: 4.79 MIL/uL (ref 4.22–5.81)
RDW: 14.6 % (ref 11.5–15.5)
WBC: 6.1 10*3/uL (ref 4.0–10.5)

## 2015-06-04 LAB — COMPREHENSIVE METABOLIC PANEL
ALBUMIN: 4.2 g/dL (ref 3.6–5.1)
ALT: 30 U/L (ref 9–46)
AST: 34 U/L (ref 10–35)
Alkaline Phosphatase: 57 U/L (ref 40–115)
BILIRUBIN TOTAL: 0.6 mg/dL (ref 0.2–1.2)
BUN: 14 mg/dL (ref 7–25)
CO2: 31 mmol/L (ref 20–31)
Calcium: 10.3 mg/dL (ref 8.6–10.3)
Chloride: 104 mmol/L (ref 98–110)
Creat: 1 mg/dL (ref 0.70–1.25)
Glucose, Bld: 73 mg/dL (ref 65–99)
Potassium: 4.4 mmol/L (ref 3.5–5.3)
Sodium: 141 mmol/L (ref 135–146)
Total Protein: 6.9 g/dL (ref 6.1–8.1)

## 2015-06-04 NOTE — Progress Notes (Signed)
[facility] [address], [place] [address] [phone]  Date:  06/04/2015   Name:  Justin Mclaughlin   DOB:  [birth year]   MRN:  [id number]  PCP:  No primary care provider on file.    Chief Complaint: Fatigue and Abdominal Pain   History of Present Illness:  Justin Mclaughlin is a 70 y.o. very pleasant male patient who presents with the following:  I last saw this pt in 04/2014. He is here today with concern of possible illness. He has noted this problem since 2005: He feels like he gives off a bad odor when he sweats.  He has tried taking antioxidants but this does not help.  Drinking carbonated drinks makes it worse.  He has also notes some gurgling in his belly and a lot of gas at times.    No nausea or vomiting.  He does not have constipation He does a lot of physical work at his job.  This seems to make him sweat and smell badly and he is concerned that this is causing problems at his job.  He wears a uniform at his job which has been sweated in a lot.    He does not have abdominal pain He is eating well Otherwise he has been generally healthy No weight changes   Wt Readings from Last 3 Encounters:  06/04/15 176 lb 3.2 oz (79.924 kg)  05/11/14 177 lb 6.4 oz (80.468 kg)  03/05/14 177 lb (80.287 kg)     Patient Active Problem List   Diagnosis Date Noted  . 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 High cholesterol 01/27/2013  . Proteinuria 01/27/2013    Past Medical History  Diagnosis Date  . Allergy     History reviewed. No pertinent past surgical history.  Social History  Substance Use Topics  . Smoking status: Never Smoker   . Smokeless tobacco: Never Used  . Alcohol Use: 0.0 - 1.0 oz/week    0-2 drink(s) per week     Comment: once a month    Family History  Problem Relation Age of Onset  . Cancer Sister     breast cancer    No Known Allergies  Medication list has been reviewed and updated.  Current Outpatient  Prescriptions on File Prior to Visit  Medication Sig Dispense Refill  . Acetaminophen (TYLENOL 8 HOUR PO) Take by mouth.    . naproxen (NAPROSYN) 500 MG tablet Take 1 tablet (500 mg total) by mouth 2 (two) times daily as needed. (Patient not taking: Reported on 06/04/2015) 60 tablet 0  . predniSONE (DELTASONE) 20 MG tablet Take 2 tablets (40 mg total) by mouth daily. (Patient not taking: Reported on 06/04/2015) 10 tablet 0   No current facility-administered medications on file prior to visit.    Review of Systems:  As per HPI- otherwise negative.   Physical Examination: Filed Vitals:   06/04/15 1527  BP: 118/72  Pulse: 62  Temp: 97.8 F (36.6 C)  Resp: 18   Filed Vitals:   06/04/15 1527  Height: 5\' 7"  (1.702 m)  Weight: 176 lb 3.2 oz (79.924 kg)   Body mass index is 27.59 kg/(m^2). Ideal Body Weight: Weight in (lb) to have BMI = 25: 159.3  GEN: WDWN, NAD, Non-toxic, A & O x 3, looks well, mild overweight,  Is clean and well groomed and does not have any body odor that I can ascertain HEENT: Atraumatic, Normocephalic. Neck supple. No masses, No LAD.  Ears and Nose: No external deformity. CV: RRR, No M/G/R. No JVD. No thrill. No extra heart sounds. PULM: CTA B, no wheezes, crackles, rhonchi. No retractions. No resp. distress. No accessory muscle use. ABD: S, NT, ND, +BS. No rebound. No HSM. EXTR: No c/c/e NEURO Normal gait.  PSYCH: Normally interactive. Conversant. Not depressed or anxious appearing.  Calm demeanor.    Assessment and Plan: Body odor - Plan: CBC, Comprehensive metabolic panel  He will try 2 weeks of prilosec for mild GI upset  Reassured that there is unlikely to be any health problem causing body odor.  He likely has normal odor that may be exacerbated by his active job and wearing a uniform that is sweated in often.   Discussed strategies for reducing this issue for him Will plan further follow- up pending labs.   Signed Lamar Blinks, MD

## 2015-06-04 NOTE — Patient Instructions (Addendum)
You do not have any abnormal body odor that I can appreciate- it sounds like you have a normal response to physical work which can cause [place] to sweat.    You might try Mitchem solid stick deodorant.  This is one of the strongest that you can buy over the counter.  Also, you could change your shirt and re-apply deodorant half- way through the day.   Bathing with soap and water in the morning before work can also help.   In addition, some clothing that is sweated in a lot can pick up odors. Try buying some new undershirts.  Also, washing clothes in a special sports detergent adding vinegar can help.    However, there is likely nothing wrong with you causing body odor; our society has just decided that normal body odor is offensive.    I will be in touch with your labs asap.

## 2015-06-05 ENCOUNTER — Encounter: Payer: Self-pay | Admitting: Family Medicine

## 2015-08-26 ENCOUNTER — Ambulatory Visit (INDEPENDENT_AMBULATORY_CARE_PROVIDER_SITE_OTHER): Payer: PPO | Admitting: Family Medicine

## 2015-08-26 VITALS — BP 122/68 | HR 64 | Temp 97.6°F | Resp 18 | Ht 67.0 in | Wt 182.2 lb

## 2015-08-26 DIAGNOSIS — M7750 Other enthesopathy of unspecified foot: Secondary | ICD-10-CM

## 2015-08-26 DIAGNOSIS — N400 Enlarged prostate without lower urinary tract symptoms: Secondary | ICD-10-CM

## 2015-08-26 DIAGNOSIS — L299 Pruritus, unspecified: Secondary | ICD-10-CM | POA: Diagnosis not present

## 2015-08-26 DIAGNOSIS — M6588 Other synovitis and tenosynovitis, other site: Secondary | ICD-10-CM | POA: Diagnosis not present

## 2015-08-26 MED ORDER — NEOMYCIN-POLYMYXIN-HC 3.5-10000-1 OT SOLN: 3.0000 [drp] | Freq: Four times a day (QID) | OTIC | Status: AC

## 2015-08-26 MED ORDER — METHYLPREDNISOLONE 4 MG PO TABS: 4.0000 mg | ORAL_TABLET | Freq: Every day | ORAL | Status: AC

## 2015-08-26 MED ORDER — TAMSULOSIN HCL 0.4 MG PO CAPS: 0.4000 mg | ORAL_CAPSULE | Freq: Every day | ORAL | Status: AC

## 2015-08-26 NOTE — Progress Notes (Signed)
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 Comes in with several problems.  The first problem is burning on the tops of his feet. This been going on for over a month. He says just intermittent but it gets to be quite bothersome and even though he rubs it, he cannot get the pain to subside. He has tried topical cortisone creams without success.  His second problem is that his ears itch. This is been going on off and on for a month or so.  His third problem is that he goes to the bathroom frequently at night. He's had this problem before and took some medicine which helped but he stopped taking the medicine. He is no burning or fever.  Objective: Examination of feet reveals no bony abnormality, no rash, no tenderness. He has good pulses bilaterally. There is no edema. He has good range of motion bilaterally.  Examination of the ears and ear canals reveals normal features.  Assessment: This chart was scribed in my presence and reviewed by me personally.    ICD-9-CM ICD-10-CM   1. BPH (benign prostatic hyperplasia) 600.00 N40.0 tamsulosin (FLOMAX) 0.4 MG CAPS capsule  2. Tendonitis of foot 727.06 M65.88 methylPREDNISolone (MEDROL) 4 MG tablet  3. Ear itching 698.9 L29.9 neomycin-polymyxin-hydrocortisone (CORTISPORIN) otic solution     Signed, Robyn Haber, MD

## 2015-08-26 NOTE — Patient Instructions (Signed)
Let me know if you are not getting better in the next week

## 2015-12-14 ENCOUNTER — Ambulatory Visit (INDEPENDENT_AMBULATORY_CARE_PROVIDER_SITE_OTHER): Payer: PPO | Admitting: Physician Assistant

## 2015-12-14 VITALS — BP 124/82 | HR 59 | Temp 98.2°F | Resp 17 | Ht 68.0 in | Wt 182.0 lb

## 2015-12-14 DIAGNOSIS — N400 Enlarged prostate without lower urinary tract symptoms: Secondary | ICD-10-CM | POA: Diagnosis not present

## 2015-12-14 DIAGNOSIS — R300 Dysuria: Secondary | ICD-10-CM | POA: Diagnosis not present

## 2015-12-14 LAB — CBC WITH DIFFERENTIAL/PLATELET
BASOS ABS: 0 {cells}/uL (ref 0–200)
Basophils Relative: 0 %
Eosinophils Absolute: 330 cells/uL (ref 15–500)
Eosinophils Relative: 6 %
HEMATOCRIT: 44.2 % (ref 38.5–50.0)
HEMOGLOBIN: 14.7 g/dL (ref 13.2–17.1)
LYMPHS ABS: 2035 {cells}/uL (ref 850–3900)
Lymphocytes Relative: 37 %
MCH: 29.9 pg (ref 27.0–33.0)
MCHC: 33.3 g/dL (ref 32.0–36.0)
MCV: 89.8 fL (ref 80.0–100.0)
MONO ABS: 385 {cells}/uL (ref 200–950)
MPV: 10.4 fL (ref 7.5–12.5)
Monocytes Relative: 7 %
NEUTROS ABS: 2750 {cells}/uL (ref 1500–7800)
NEUTROS PCT: 50 %
Platelets: 244 10*3/uL (ref 140–400)
RBC: 4.92 MIL/uL (ref 4.20–5.80)
RDW: 14.5 % (ref 11.0–15.0)
WBC: 5.5 10*3/uL (ref 3.8–10.8)

## 2015-12-14 LAB — POCT URINALYSIS DIP (MANUAL ENTRY)
BILIRUBIN UA: NEGATIVE
Glucose, UA: NEGATIVE
Ketones, POC UA: NEGATIVE
LEUKOCYTES UA: NEGATIVE
NITRITE UA: NEGATIVE
PH UA: 6
PROTEIN UA: NEGATIVE
RBC UA: NEGATIVE
Spec Grav, UA: 1.015
UROBILINOGEN UA: 0.2

## 2015-12-14 LAB — POC MICROSCOPIC URINALYSIS (UMFC): Mucus: ABSENT

## 2015-12-14 MED ORDER — TERAZOSIN HCL 1 MG PO CAPS: ORAL_CAPSULE | ORAL | 6 refills | Status: AC

## 2015-12-14 NOTE — Progress Notes (Signed)
12/14/2015 3:45 PM   DOB: 08-14-1945 / MRN: [id number]  SUBJECTIVE:  Justin Mclaughlin is a 70 y.o. male presenting for difficulty starting a urinary stream.  This problem has been present for roughly two years now and has been getting worse.  He associates post void dribbling, urinary urgency, frequency, and incomplete bladder emptying. He feels he is slowly getting worse. Denies myalgia, joint pain, fever, chills, nausea.    He has No Known Allergies.   He  has a past medical history of Allergy.    He  reports that he has never smoked. He has never used smokeless tobacco. He reports that he drinks alcohol. He reports that he does not use drugs. He  reports that he currently engages in sexual activity and has had male partners. The patient  has no past surgical history on file.  His family history includes Cancer in his sister.  Review of Systems  Constitutional: Negative for chills and fever.  Gastrointestinal: Negative for nausea.  Genitourinary: Positive for frequency and urgency. Negative for dysuria, flank pain and hematuria.  Skin: Negative for itching and rash.  Neurological: Negative for dizziness and headaches.  Psychiatric/Behavioral: Negative for depression.    Per HPI  Problem list and medications reviewed and updated by myself where necessary, and exist elsewhere in the encounter.   OBJECTIVE:  BP 124/82 (BP Location: Right Arm, Patient Position: Sitting, Cuff Size: Normal)   Pulse (!) 59   Temp 98.2 F (36.8 C) (Oral)   Resp 17   Ht 5\' 8"  (1.727 m)   Wt 182 lb (82.6 kg)   SpO2 98%   BMI 27.67 kg/m   Physical Exam  Constitutional: He is oriented to person, place, and time.  Cardiovascular: Normal rate and regular rhythm.   Pulmonary/Chest: Effort normal and breath sounds normal.  Genitourinary: Prostate is enlarged (boggy). Prostate is not tender.  Neurological: He is alert and oriented to person, place, and time.    Results for orders placed or  performed in visit on 12/14/15 (from the past 72 hour(s))  POCT urinalysis dipstick     Status: None   Collection Time: 12/14/15  2:57 PM  Result Value Ref Range   Color, UA yellow yellow   Clarity, UA clear clear   Glucose, UA negative negative   Bilirubin, UA negative negative   Ketones, POC UA negative negative   Spec Grav, UA 1.015    Blood, UA negative negative   pH, UA 6.0    Protein Ur, POC negative negative   Urobilinogen, UA 0.2    Nitrite, UA Negative Negative   Leukocytes, UA Negative Negative  POCT Microscopic Urinalysis ([facility])     Status: Abnormal   Collection Time: 12/14/15  3:03 PM  Result Value Ref Range   WBC,UR,HPF,POC None None WBC/hpf   RBC,UR,HPF,POC None None RBC/hpf   Bacteria None None, Too numerous to count   Mucus Absent Absent   Epithelial Cells, UR Per Microscopy Few (A) None, Too numerous to count cells/hpf    No results found.  ASSESSMENT AND PLAN  Ala was seen today for dysuria.  Diagnoses and all orders for this visit:  Dysuria -     POCT urinalysis dipstick -     POCT Microscopic Urinalysis ([facility]) -     PSA -     CBC with Differential  BPH (benign prostatic hyperplasia): HPI consistent with this.  Doubt prostatitis as he feels well.   -     terazosin (  HYTRIN) 1 MG capsule; Take one tab at night for three days, then start taking 2 tabs.    The patient was advised to call or return to clinic if he does not see an improvement in symptoms, or to seek the care of the closest emergency department if he worsens with the above plan.   Philis Fendt, MHS, PA-C [facility] 12/14/2015 3:45 PM

## 2015-12-14 NOTE — Patient Instructions (Addendum)
Call me if the medication seems to be helpful but not helpful enough so I can increase the dose.   Please see me in three months regardless so we can check in and see how you are doing with this problem.    IF you received an x-ray today, you will receive an invoice from [facility]. Please contact [facility] at [phone] with questions or concerns regarding your invoice.   IF you received labwork today, you will receive an invoice from [facility]. Please contact [facility] at [phone] with questions or concerns regarding your invoice.   Our billing staff will not be able to assist you with questions regarding bills from these companies.  You will be contacted with the lab results as soon as they are available. The fastest way to get your results is to activate your My Chart account. Instructions are located on the last page of this paperwork. If you have not heard from [place] regarding the results in 2 weeks, please contact this office.

## 2015-12-15 ENCOUNTER — Encounter: Payer: Self-pay | Admitting: *Deleted

## 2015-12-15 LAB — PSA: PSA: 3.01 ng/mL (ref <=4.00)
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
## 2016-08-07 DIAGNOSIS — M25551 Pain in right hip: Secondary | ICD-10-CM | POA: Diagnosis not present

## 2016-08-28 DIAGNOSIS — R202 Paresthesia of skin: Secondary | ICD-10-CM | POA: Diagnosis not present

## 2016-08-28 DIAGNOSIS — Z7689 Persons encountering health services in other specified circumstances: Secondary | ICD-10-CM | POA: Diagnosis not present

## 2016-08-28 DIAGNOSIS — M25551 Pain in right hip: Secondary | ICD-10-CM | POA: Diagnosis not present

## 2016-12-11 ENCOUNTER — Telehealth: Payer: Self-pay

## 2016-12-11 NOTE — Telephone Encounter (Signed)
Received notice that patient had a Shingrix 56mcg injection, and a Pneumovax 23 32mcg/0.5mL injection done by [facility]. Injections entered into chart and notes sent to scanning./ S.Hydia Copelin,CMA

## 2017-01-05 DIAGNOSIS — Z1211 Encounter for screening for malignant neoplasm of colon: Secondary | ICD-10-CM | POA: Diagnosis not present

## 2017-01-05 DIAGNOSIS — Z0000 Encounter for general adult medical examination without abnormal findings: Secondary | ICD-10-CM | POA: Diagnosis not present

## 2017-01-05 DIAGNOSIS — D649 Anemia, unspecified: Secondary | ICD-10-CM | POA: Diagnosis not present

## 2017-01-05 DIAGNOSIS — N401 Enlarged prostate with lower urinary tract symptoms: Secondary | ICD-10-CM | POA: Diagnosis not present

## 2017-01-10 DIAGNOSIS — D649 Anemia, unspecified: Secondary | ICD-10-CM | POA: Diagnosis not present

## 2017-01-12 DIAGNOSIS — Z23 Encounter for immunization: Secondary | ICD-10-CM | POA: Diagnosis not present

## 2017-02-01 ENCOUNTER — Other Ambulatory Visit: Payer: Self-pay | Admitting: Physician Assistant

## 2017-02-01 DIAGNOSIS — N400 Enlarged prostate without lower urinary tract symptoms: Secondary | ICD-10-CM

## 2017-02-15 DIAGNOSIS — N401 Enlarged prostate with lower urinary tract symptoms: Secondary | ICD-10-CM | POA: Diagnosis not present

## 2017-02-15 DIAGNOSIS — N3943 Post-void dribbling: Secondary | ICD-10-CM | POA: Diagnosis not present

## 2017-03-07 DIAGNOSIS — Z23 Encounter for immunization: Secondary | ICD-10-CM | POA: Diagnosis not present

## 2017-03-07 DIAGNOSIS — Z86010 Personal history of colonic polyps: Secondary | ICD-10-CM | POA: Diagnosis not present

## 2017-03-07 DIAGNOSIS — D649 Anemia, unspecified: Secondary | ICD-10-CM | POA: Diagnosis not present

## 2017-03-07 DIAGNOSIS — K5900 Constipation, unspecified: Secondary | ICD-10-CM | POA: Diagnosis not present

## 2017-03-23 DIAGNOSIS — Z1211 Encounter for screening for malignant neoplasm of colon: Secondary | ICD-10-CM | POA: Diagnosis not present

## 2017-03-23 DIAGNOSIS — K644 Residual hemorrhoidal skin tags: Secondary | ICD-10-CM | POA: Diagnosis not present

## 2017-03-23 DIAGNOSIS — K648 Other hemorrhoids: Secondary | ICD-10-CM | POA: Diagnosis not present

## 2017-03-23 DIAGNOSIS — Z86010 Personal history of colonic polyps: Secondary | ICD-10-CM | POA: Diagnosis not present
